# Patient Record
Sex: Female | Born: 2000 | Race: White | Hispanic: No | Marital: Single | Smoking: Never smoker
Health system: Southern US, Community
[De-identification: ages and names within clinical notes are randomized; demographics above are authoritative.]

## PROBLEM LIST (undated history)

## (undated) DIAGNOSIS — S82892A Other fracture of left lower leg, initial encounter for closed fracture: Secondary | ICD-10-CM

## (undated) HISTORY — DX: Other fracture of left lower leg, initial encounter for closed fracture: S82.892A

---
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
## 2020-09-03 ENCOUNTER — Other Ambulatory Visit: Payer: Self-pay | Admitting: Obstetrics and Gynecology

## 2020-09-03 DIAGNOSIS — N6310 Unspecified lump in the right breast, unspecified quadrant: Secondary | ICD-10-CM

## 2024-06-16 ENCOUNTER — Ambulatory Visit
Admission: EM | Admit: 2024-06-16 | Discharge: 2024-06-16 | Disposition: A | Discharge status: Home / Self Care | Attending: Family Medicine | Admitting: Family Medicine

## 2024-06-16 ENCOUNTER — Other Ambulatory Visit: Payer: Self-pay

## 2024-06-16 ENCOUNTER — Encounter: Payer: Self-pay | Admitting: Emergency Medicine

## 2024-06-16 DIAGNOSIS — J0100 Acute maxillary sinusitis, unspecified: Secondary | ICD-10-CM

## 2024-06-16 MED ORDER — BENZONATATE 200 MG PO CAPS: 200.0000 mg | ORAL_CAPSULE | Freq: Three times a day (TID) | ORAL | 0 refills | Status: AC | PRN

## 2024-06-16 MED ORDER — AMOXICILLIN-POT CLAVULANATE 875-125 MG PO TABS: 1.0000 | ORAL_TABLET | Freq: Two times a day (BID) | ORAL | 0 refills | Status: AC

## 2024-06-16 NOTE — Discharge Instructions (Addendum)
 You were diagnosed with a sinus infection today.  I have sent out an antibiotic, augmentin, to take twice/day x 7 days.  Please take all the medication as directed.  I have also sent out a medication for cough.  Please return if not improving as directed.

## 2024-06-16 NOTE — ED Triage Notes (Signed)
 Pt here for cough and nasal congestion with sinus drainage x 2 weeks; pt sts pain with cough

## 2024-06-16 NOTE — ED Provider Notes (Signed)
 [facility]    CSN: [id number] Arrival date & time: 06/16/24  1400      History   Chief Complaint Chief Complaint  Patient presents with   Cough    HPI Linda Griffin is a 23 y.o. female.    Cough  Patient is here for URI symptoms x 2 weeks.  Worsened 2 days ago.  Having a cough, sinus congestion and drainage.  Also with sinus pain/pressure.   No fevers/chills.  No n/v.  Using some otc medications  She had some left over keflex, took one yesterday and today.   She also states she had a zpack at home and took this as well.        Past Medical History:  Diagnosis Date   Closed left ankle fracture    Age 58,  [place] [facility]    There are no active problems to display for this patient.   History reviewed. No pertinent surgical history.  OB History   No obstetric history on file.      Home Medications    Prior to Admission medications   Medication Sig Start Date End Date Taking? Authorizing Provider  acetaminophen -codeine  (TYLENOL  #3) 300-30 MG per tablet Take 1 tablet by mouth every 4 (four) hours as needed for pain. 01/10/13   Ladora Jaynie SQUIBB, DO    Family History History reviewed. No pertinent family history.  Social History Social History   Tobacco Use   Smoking status: Never  Substance Use Topics   Alcohol use: No   Drug use: No     Allergies   Patient has no known allergies.   Review of Systems Review of Systems  Constitutional: Negative.   HENT:  Positive for congestion, sinus pressure and sinus pain.   Respiratory:  Positive for cough.   Cardiovascular: Negative.   Gastrointestinal: Negative.   Musculoskeletal: Negative.   Psychiatric/Behavioral: Negative.       Physical Exam Triage Vital Signs ED Triage Vitals [06/16/24 1417]  Encounter Vitals Group     BP 114/81     Girls Systolic BP Percentile      Girls Diastolic BP Percentile      Boys Systolic BP Percentile      Boys Diastolic BP Percentile      Pulse Rate  100     Resp 18     Temp 98.2 F (36.8 C)     Temp Source Oral     SpO2 95 %     Weight      Height      Head Circumference      Peak Flow      Pain Score 4     Pain Loc      Pain Education      Exclude from Growth Chart    No data found.  Updated Vital Signs BP 114/81 (BP Location: Right Arm)   Pulse 100   Temp 98.2 F (36.8 C) (Oral)   Resp 18   SpO2 95%   Visual Acuity Right Eye Distance:   Left Eye Distance:   Bilateral Distance:    Right Eye Near:   Left Eye Near:    Bilateral Near:     Physical Exam Constitutional:      General: She is not in acute distress.    Appearance: Normal appearance. She is normal weight. She is not ill-appearing or toxic-appearing.  HENT:     Nose: Congestion and rhinorrhea present.     Right Sinus: Maxillary  sinus tenderness present.     Left Sinus: Maxillary sinus tenderness present.  Cardiovascular:     Rate and Rhythm: Normal rate and regular rhythm.  Pulmonary:     Effort: Pulmonary effort is normal.     Breath sounds: Normal breath sounds. No wheezing or rhonchi.  Musculoskeletal:     Cervical back: Normal range of motion and neck supple.  Skin:    General: Skin is warm.  Neurological:     General: No focal deficit present.     Mental Status: She is alert.  Psychiatric:        Mood and Affect: Mood normal.      UC Treatments / Results  Labs (all labs ordered are listed, but only abnormal results are displayed) Labs Reviewed - No data to display  EKG   Radiology No results found.  Procedures Procedures (including critical care time)  Medications Ordered in UC Medications - No data to display  Initial Impression / Assessment and Plan / UC Course  I have reviewed the triage vital signs and the nursing notes.  Pertinent labs & imaging results that were available during my care of the patient were reviewed by me and considered in my medical decision making (see chart for details).    Patient was seen  today for acute sinusitis.  She had left over keflex and a zpack which she started for this.  Explained the importance of taking all medications as prescribed by her provider, that she should not have left over antibiotics at home, and she should not start left over medications for treatment without as directed by her provider.   Final Clinical Impressions(s) / UC Diagnoses   Final diagnoses:  Acute non-recurrent maxillary sinusitis     Discharge Instructions      You were diagnosed with a sinus infection today.  I have sent out an antibiotic, augmentin, to take twice/day x 7 days.  Please take all the medication as directed.  I have also sent out a medication for cough.  Please return if not improving as directed.     ED Prescriptions     Medication Sig Dispense Auth. Provider   amoxicillin-clavulanate (AUGMENTIN) 875-125 MG tablet Take 1 tablet by mouth every 12 (twelve) hours. 14 tablet Keisha Amer, MD   benzonatate (TESSALON) 200 MG capsule Take 1 capsule (200 mg total) by mouth 3 (three) times daily as needed for cough. 21 capsule Darral Longs, MD      PDMP not reviewed this encounter.   Darral Longs, MD 06/16/24 1431
